# Patient Record
Sex: Female | Born: 2005 | Race: Black or African American | Hispanic: No | Marital: Single | Smoking: Current some day smoker
Health system: Southern US, Community
[De-identification: ages and names within clinical notes are randomized; demographics above are authoritative.]

## PROBLEM LIST (undated history)

## (undated) DIAGNOSIS — Z789 Other specified health status: Secondary | ICD-10-CM

## (undated) DIAGNOSIS — F909 Attention-deficit hyperactivity disorder, unspecified type: Secondary | ICD-10-CM

## (undated) HISTORY — DX: Attention-deficit hyperactivity disorder, unspecified type: F90.9

## (undated) HISTORY — PX: NO PAST SURGERIES: SHX2092

## (undated) HISTORY — DX: Other specified health status: Z78.9

---
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
## 2023-03-19 ENCOUNTER — Encounter: Payer: Self-pay | Admitting: Nurse Practitioner

## 2023-03-19 ENCOUNTER — Ambulatory Visit (INDEPENDENT_AMBULATORY_CARE_PROVIDER_SITE_OTHER): Payer: Medicaid Other | Admitting: Nurse Practitioner

## 2023-03-19 VITALS — BP 102/72 | HR 104 | Ht 61.5 in | Wt 160.0 lb

## 2023-03-19 DIAGNOSIS — Z30432 Encounter for removal of intrauterine contraceptive device: Secondary | ICD-10-CM

## 2023-03-19 NOTE — Progress Notes (Signed)
   Acute Office Visit  Subjective:    Patient ID: Carrie Mclean, female    DOB: 30-Mar-2005, 18 y.o.   MRN: [id number]   HPI 18 y.o. G0 presents as new patient for IUD removal. Kyleena placed 11/2021. Has frequent cramping with it and menses are longer. Menses are light. Not interested in alternative contraception at this time. Not sexually active. No menstrual issues prior to IUD placement. Mother present during visit.   No LMP recorded. (Menstrual status: IUD).    Review of Systems  Constitutional: Negative.   Genitourinary: Negative.        Objective:    Physical Exam Constitutional:      Appearance: Normal appearance.  Genitourinary:    General: Normal vulva.     Vagina: Normal.     Cervix: Normal.     Comments: + IUD string - grasped with ring forceps and removed with ease    BP 102/72   Pulse (!) 104   Ht 5' 1.5" (1.562 m)   Wt 160 lb (72.6 kg)   SpO2 97%   BMI 29.74 kg/m  Wt Readings from Last 3 Encounters:  03/19/23 160 lb (72.6 kg) (88%, Z= 1.17)*  12/06/21 158 lb 6.4 oz (71.8 kg) (89%, Z= 1.23)*  11/08/21 157 lb 12.8 oz (71.6 kg) (89%, Z= 1.22)*   * Growth percentiles are based on CDC (Girls, 2-20 Years) data.        Patient informed chaperone available to be present for breast and/or pelvic exam. Patient has requested no chaperone to be present. Patient has been advised what will be completed during breast and pelvic exam.   Assessment & Plan:   Problem List Items Addressed This Visit   None Visit Diagnoses       Encounter for IUD removal    -  Primary   Relevant Orders   IUD removal      Plan: Not interested in alternative contraception at this time. Tolerated IUD removal.  Return if symptoms worsen or fail to improve.    Olivia Mackie DNP, 9:37 AM 03/19/2023

## 2023-05-12 IMAGING — DX DG ANKLE COMPLETE 3+V*L*
3 series · 3 of 3 positions shown · non-contrast
Comparison: None.

CLINICAL DATA: Rolled ankle yesterday. Swelling at the lateral
aspect of the ankle and unable to bear weight.

EXAM:
LEFT ANKLE COMPLETE - 3+ VIEW

[x ankle ap left]
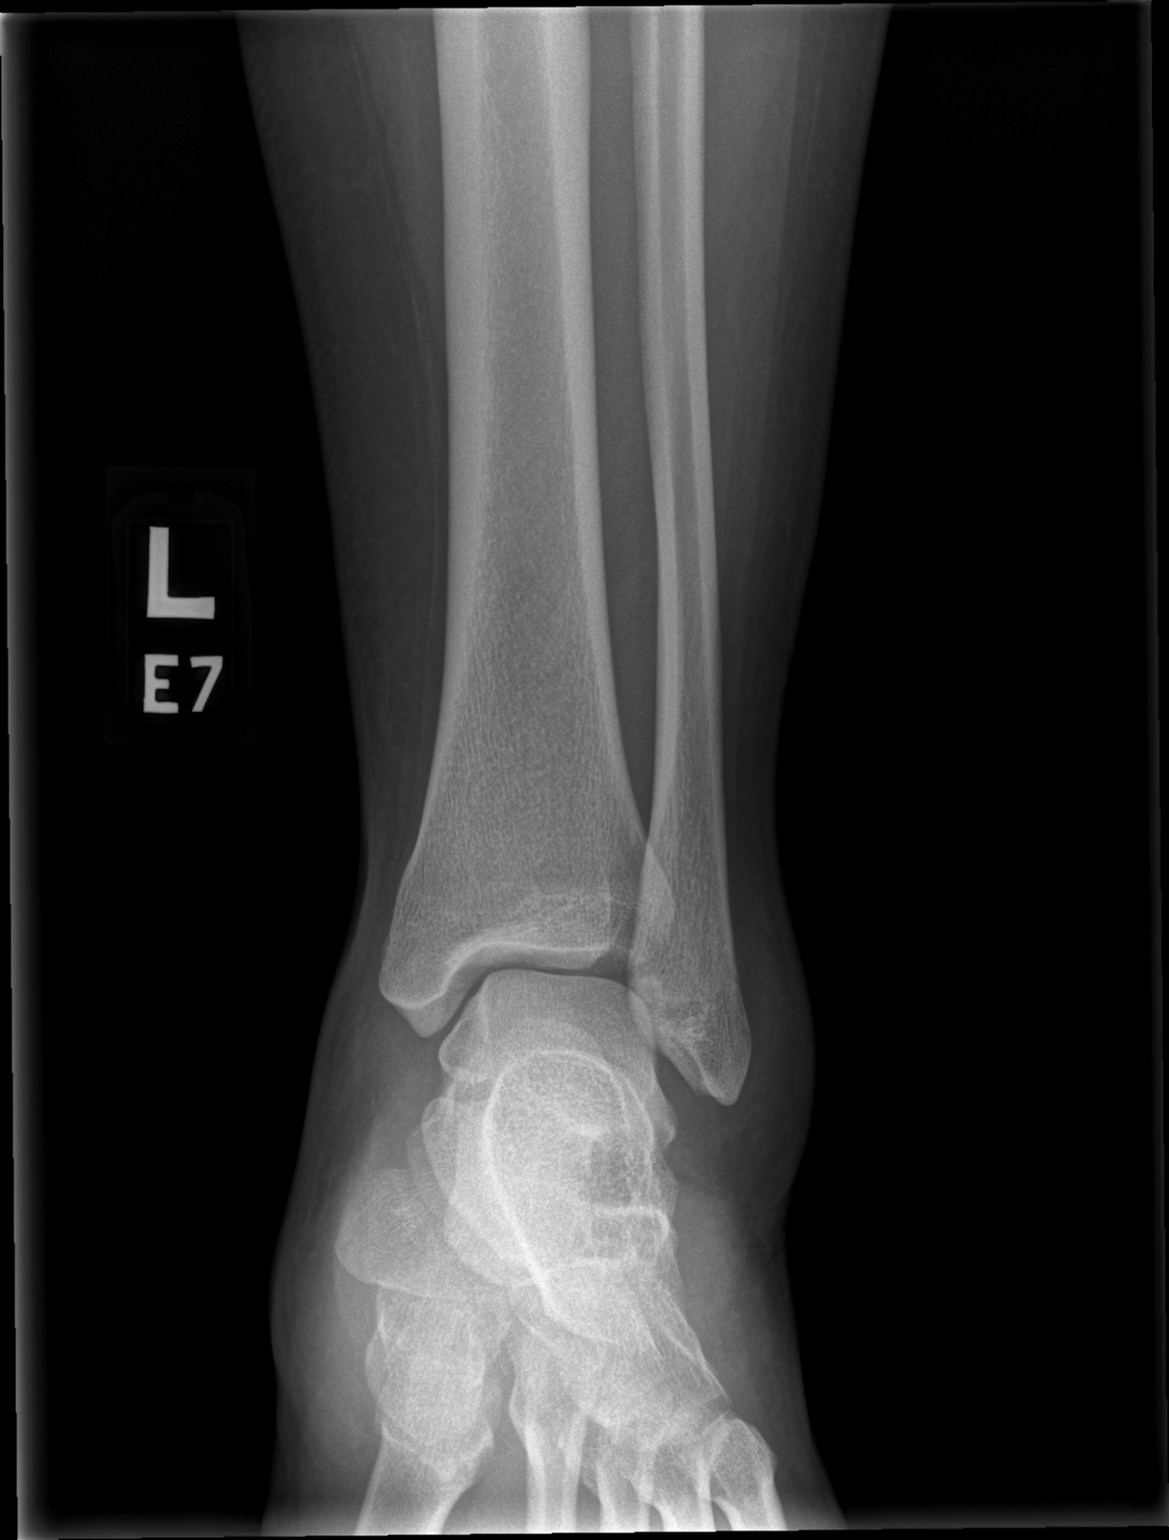

[x ankle obl left]
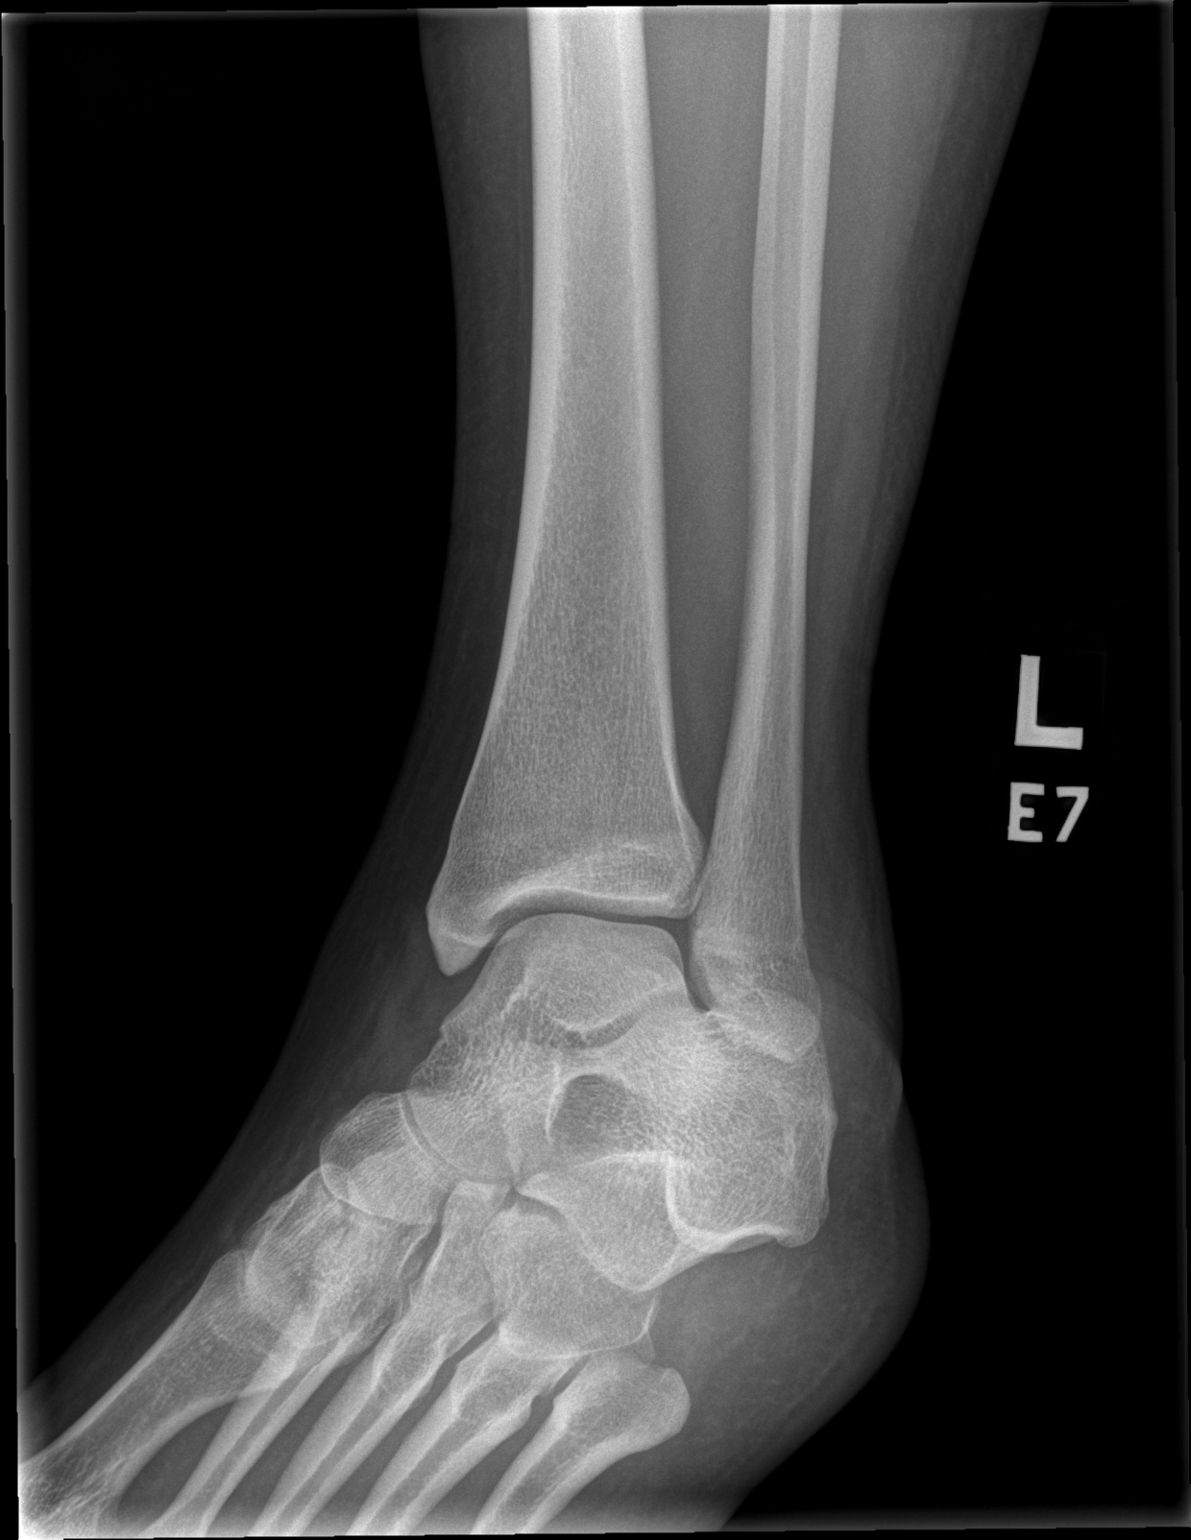

[x ankle lat left]
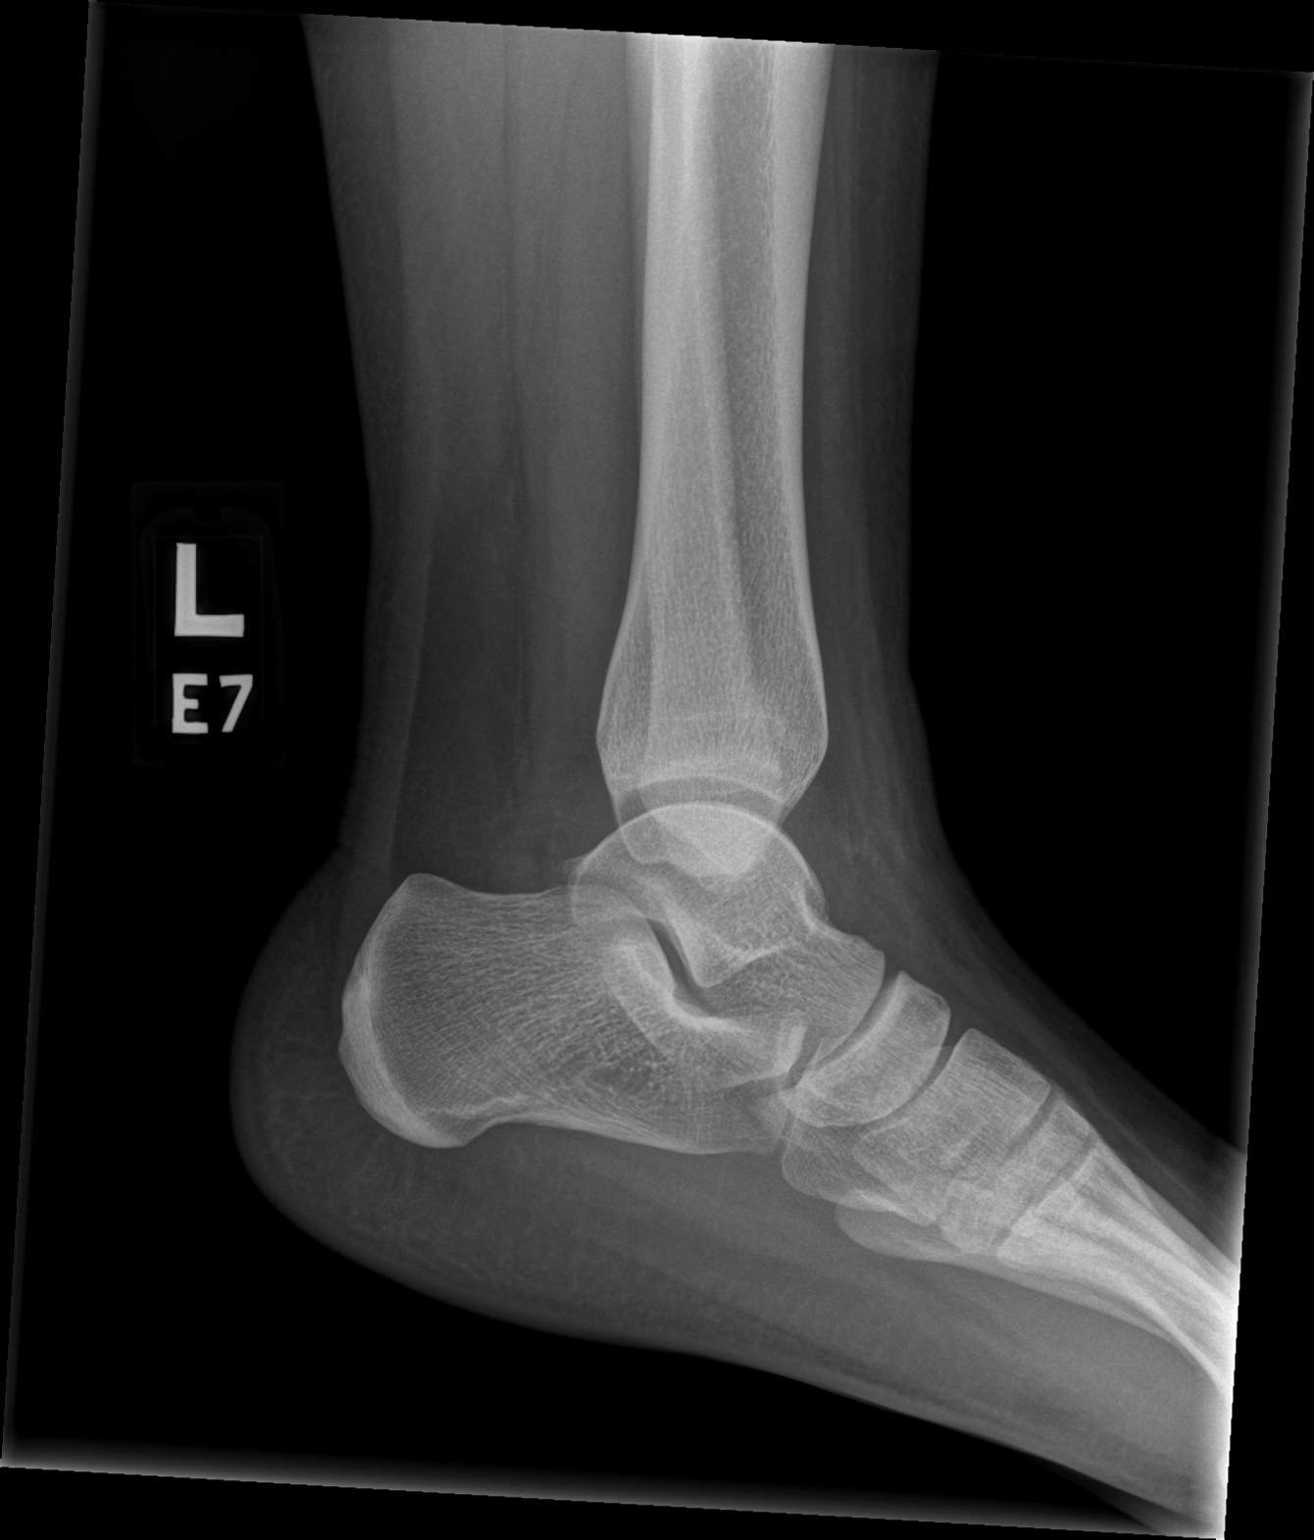

[3 of 3 positions shown; findings below may reference images not displayed]

FINDINGS: There is no evidence of fracture, dislocation, or joint effusion.
There is no evidence of arthropathy or other focal bone abnormality.
Soft tissue swelling at the ankle, particularly over the lateral
malleolus.
IMPRESSION: No acute osseous abnormality of the left ankle.
